# Patient Record
Sex: Female | Born: 1940 | Race: White | Hispanic: No | Marital: Married | Smoking: Current some day smoker
Health system: Southern US, Community
[De-identification: ages and names within clinical notes are randomized; demographics above are authoritative.]

## PROBLEM LIST (undated history)

## (undated) DIAGNOSIS — F419 Anxiety disorder, unspecified: Secondary | ICD-10-CM

## (undated) DIAGNOSIS — M1990 Unspecified osteoarthritis, unspecified site: Secondary | ICD-10-CM

## (undated) DIAGNOSIS — F329 Major depressive disorder, single episode, unspecified: Secondary | ICD-10-CM

## (undated) DIAGNOSIS — F32A Depression, unspecified: Secondary | ICD-10-CM

## (undated) DIAGNOSIS — R519 Headache, unspecified: Secondary | ICD-10-CM

## (undated) DIAGNOSIS — I10 Essential (primary) hypertension: Secondary | ICD-10-CM

## (undated) DIAGNOSIS — K219 Gastro-esophageal reflux disease without esophagitis: Secondary | ICD-10-CM

## (undated) DIAGNOSIS — R51 Headache: Secondary | ICD-10-CM

## (undated) HISTORY — PX: EYE SURGERY: SHX253

## (undated) HISTORY — PX: DILATION AND CURETTAGE OF UTERUS: SHX78

---

## 1998-07-28 ENCOUNTER — Other Ambulatory Visit: Admission: RE | Admit: 1998-07-28 | Discharge: 1998-07-28 | Payer: Self-pay | Admitting: Obstetrics & Gynecology

## 1999-07-29 ENCOUNTER — Other Ambulatory Visit: Admission: RE | Admit: 1999-07-29 | Discharge: 1999-07-29 | Payer: Self-pay | Admitting: Obstetrics & Gynecology

## 2000-09-07 ENCOUNTER — Other Ambulatory Visit: Admission: RE | Admit: 2000-09-07 | Discharge: 2000-09-07 | Payer: Self-pay | Admitting: Obstetrics & Gynecology

## 2003-08-18 ENCOUNTER — Other Ambulatory Visit: Admission: RE | Admit: 2003-08-18 | Discharge: 2003-08-18 | Payer: Self-pay | Admitting: Obstetrics & Gynecology

## 2004-09-30 ENCOUNTER — Other Ambulatory Visit: Admission: RE | Admit: 2004-09-30 | Discharge: 2004-09-30 | Payer: Self-pay | Admitting: Obstetrics & Gynecology

## 2005-10-31 ENCOUNTER — Other Ambulatory Visit: Admission: RE | Admit: 2005-10-31 | Discharge: 2005-10-31 | Payer: Self-pay | Admitting: Obstetrics & Gynecology

## 2012-05-03 DIAGNOSIS — Z23 Encounter for immunization: Secondary | ICD-10-CM | POA: Diagnosis not present

## 2012-05-03 DIAGNOSIS — Z79899 Other long term (current) drug therapy: Secondary | ICD-10-CM | POA: Diagnosis not present

## 2012-05-03 DIAGNOSIS — E7800 Pure hypercholesterolemia, unspecified: Secondary | ICD-10-CM | POA: Diagnosis not present

## 2012-05-03 DIAGNOSIS — I129 Hypertensive chronic kidney disease with stage 1 through stage 4 chronic kidney disease, or unspecified chronic kidney disease: Secondary | ICD-10-CM | POA: Diagnosis not present

## 2012-07-03 DIAGNOSIS — Z01419 Encounter for gynecological examination (general) (routine) without abnormal findings: Secondary | ICD-10-CM | POA: Diagnosis not present

## 2013-05-21 DIAGNOSIS — Z1231 Encounter for screening mammogram for malignant neoplasm of breast: Secondary | ICD-10-CM | POA: Diagnosis not present

## 2014-01-06 DIAGNOSIS — Z961 Presence of intraocular lens: Secondary | ICD-10-CM | POA: Diagnosis not present

## 2014-01-22 DIAGNOSIS — H26499 Other secondary cataract, unspecified eye: Secondary | ICD-10-CM | POA: Diagnosis not present

## 2014-01-29 DIAGNOSIS — H26499 Other secondary cataract, unspecified eye: Secondary | ICD-10-CM | POA: Diagnosis not present

## 2014-02-04 DIAGNOSIS — F329 Major depressive disorder, single episode, unspecified: Secondary | ICD-10-CM | POA: Diagnosis not present

## 2014-02-04 DIAGNOSIS — E7800 Pure hypercholesterolemia, unspecified: Secondary | ICD-10-CM | POA: Diagnosis not present

## 2014-02-04 DIAGNOSIS — F3289 Other specified depressive episodes: Secondary | ICD-10-CM | POA: Diagnosis not present

## 2014-02-04 DIAGNOSIS — N183 Chronic kidney disease, stage 3 unspecified: Secondary | ICD-10-CM | POA: Diagnosis not present

## 2014-02-04 DIAGNOSIS — I129 Hypertensive chronic kidney disease with stage 1 through stage 4 chronic kidney disease, or unspecified chronic kidney disease: Secondary | ICD-10-CM | POA: Diagnosis not present

## 2014-02-04 DIAGNOSIS — Z79899 Other long term (current) drug therapy: Secondary | ICD-10-CM | POA: Diagnosis not present

## 2014-08-05 DIAGNOSIS — I129 Hypertensive chronic kidney disease with stage 1 through stage 4 chronic kidney disease, or unspecified chronic kidney disease: Secondary | ICD-10-CM | POA: Diagnosis not present

## 2014-08-05 DIAGNOSIS — F3341 Major depressive disorder, recurrent, in partial remission: Secondary | ICD-10-CM | POA: Diagnosis not present

## 2014-08-05 DIAGNOSIS — N183 Chronic kidney disease, stage 3 (moderate): Secondary | ICD-10-CM | POA: Diagnosis not present

## 2015-02-16 DIAGNOSIS — H04123 Dry eye syndrome of bilateral lacrimal glands: Secondary | ICD-10-CM | POA: Diagnosis not present

## 2015-02-16 DIAGNOSIS — Z961 Presence of intraocular lens: Secondary | ICD-10-CM | POA: Diagnosis not present

## 2015-02-27 DIAGNOSIS — L218 Other seborrheic dermatitis: Secondary | ICD-10-CM | POA: Diagnosis not present

## 2015-03-13 DIAGNOSIS — F17210 Nicotine dependence, cigarettes, uncomplicated: Secondary | ICD-10-CM | POA: Diagnosis not present

## 2015-03-13 DIAGNOSIS — Z23 Encounter for immunization: Secondary | ICD-10-CM | POA: Diagnosis not present

## 2015-03-13 DIAGNOSIS — F325 Major depressive disorder, single episode, in full remission: Secondary | ICD-10-CM | POA: Diagnosis not present

## 2015-03-13 DIAGNOSIS — Z0000 Encounter for general adult medical examination without abnormal findings: Secondary | ICD-10-CM | POA: Diagnosis not present

## 2015-03-13 DIAGNOSIS — E780 Pure hypercholesterolemia: Secondary | ICD-10-CM | POA: Diagnosis not present

## 2015-03-13 DIAGNOSIS — I129 Hypertensive chronic kidney disease with stage 1 through stage 4 chronic kidney disease, or unspecified chronic kidney disease: Secondary | ICD-10-CM | POA: Diagnosis not present

## 2015-03-13 DIAGNOSIS — Z79899 Other long term (current) drug therapy: Secondary | ICD-10-CM | POA: Diagnosis not present

## 2015-03-24 DIAGNOSIS — Z1211 Encounter for screening for malignant neoplasm of colon: Secondary | ICD-10-CM | POA: Diagnosis not present

## 2015-09-14 DIAGNOSIS — Z23 Encounter for immunization: Secondary | ICD-10-CM | POA: Diagnosis not present

## 2015-09-14 DIAGNOSIS — I129 Hypertensive chronic kidney disease with stage 1 through stage 4 chronic kidney disease, or unspecified chronic kidney disease: Secondary | ICD-10-CM | POA: Diagnosis not present

## 2015-09-14 DIAGNOSIS — E7800 Pure hypercholesterolemia, unspecified: Secondary | ICD-10-CM | POA: Diagnosis not present

## 2016-01-26 DIAGNOSIS — Z961 Presence of intraocular lens: Secondary | ICD-10-CM | POA: Diagnosis not present

## 2016-01-26 DIAGNOSIS — H18412 Arcus senilis, left eye: Secondary | ICD-10-CM | POA: Diagnosis not present

## 2016-01-26 DIAGNOSIS — H18411 Arcus senilis, right eye: Secondary | ICD-10-CM | POA: Diagnosis not present

## 2016-03-14 DIAGNOSIS — E7800 Pure hypercholesterolemia, unspecified: Secondary | ICD-10-CM | POA: Diagnosis not present

## 2016-03-14 DIAGNOSIS — F17210 Nicotine dependence, cigarettes, uncomplicated: Secondary | ICD-10-CM | POA: Diagnosis not present

## 2016-03-14 DIAGNOSIS — F331 Major depressive disorder, recurrent, moderate: Secondary | ICD-10-CM | POA: Diagnosis not present

## 2016-03-14 DIAGNOSIS — Z79899 Other long term (current) drug therapy: Secondary | ICD-10-CM | POA: Diagnosis not present

## 2016-03-14 DIAGNOSIS — I129 Hypertensive chronic kidney disease with stage 1 through stage 4 chronic kidney disease, or unspecified chronic kidney disease: Secondary | ICD-10-CM | POA: Diagnosis not present

## 2016-03-14 DIAGNOSIS — Z0000 Encounter for general adult medical examination without abnormal findings: Secondary | ICD-10-CM | POA: Diagnosis not present

## 2016-03-17 ENCOUNTER — Other Ambulatory Visit: Payer: Self-pay | Admitting: Geriatric Medicine

## 2016-03-17 DIAGNOSIS — R17 Unspecified jaundice: Secondary | ICD-10-CM

## 2016-03-18 ENCOUNTER — Ambulatory Visit
Admission: RE | Admit: 2016-03-18 | Discharge: 2016-03-18 | Disposition: A | Discharge status: Home / Self Care | Payer: BLUE CROSS/BLUE SHIELD | Source: Ambulatory Visit | Attending: Geriatric Medicine | Admitting: Geriatric Medicine

## 2016-03-18 DIAGNOSIS — R17 Unspecified jaundice: Secondary | ICD-10-CM | POA: Diagnosis not present

## 2016-03-22 ENCOUNTER — Encounter (HOSPITAL_COMMUNITY): Payer: Self-pay | Admitting: *Deleted

## 2016-03-22 ENCOUNTER — Other Ambulatory Visit: Payer: Self-pay | Admitting: Gastroenterology

## 2016-03-22 DIAGNOSIS — R740 Nonspecific elevation of levels of transaminase and lactic acid dehydrogenase [LDH]: Secondary | ICD-10-CM | POA: Diagnosis not present

## 2016-03-22 DIAGNOSIS — R932 Abnormal findings on diagnostic imaging of liver and biliary tract: Secondary | ICD-10-CM | POA: Diagnosis not present

## 2016-03-23 ENCOUNTER — Ambulatory Visit (HOSPITAL_COMMUNITY): Payer: Medicare Other | Admitting: Anesthesiology

## 2016-03-23 ENCOUNTER — Ambulatory Visit (HOSPITAL_COMMUNITY)
Admission: RE | Admit: 2016-03-23 | Discharge: 2016-03-23 | Disposition: A | Discharge status: Home / Self Care | Payer: Medicare Other | Source: Ambulatory Visit | Attending: Gastroenterology | Admitting: Gastroenterology

## 2016-03-23 ENCOUNTER — Ambulatory Visit (HOSPITAL_COMMUNITY): Payer: Medicare Other

## 2016-03-23 ENCOUNTER — Encounter (HOSPITAL_COMMUNITY)
Admission: RE | Disposition: A | Discharge status: Home / Self Care | Payer: Self-pay | Source: Ambulatory Visit | Attending: Gastroenterology

## 2016-03-23 ENCOUNTER — Encounter (HOSPITAL_COMMUNITY): Payer: Self-pay

## 2016-03-23 DIAGNOSIS — M1990 Unspecified osteoarthritis, unspecified site: Secondary | ICD-10-CM | POA: Diagnosis not present

## 2016-03-23 DIAGNOSIS — E7800 Pure hypercholesterolemia, unspecified: Secondary | ICD-10-CM | POA: Diagnosis not present

## 2016-03-23 DIAGNOSIS — Z7982 Long term (current) use of aspirin: Secondary | ICD-10-CM | POA: Diagnosis not present

## 2016-03-23 DIAGNOSIS — K8020 Calculus of gallbladder without cholecystitis without obstruction: Secondary | ICD-10-CM | POA: Diagnosis not present

## 2016-03-23 DIAGNOSIS — R945 Abnormal results of liver function studies: Secondary | ICD-10-CM

## 2016-03-23 DIAGNOSIS — K8050 Calculus of bile duct without cholangitis or cholecystitis without obstruction: Secondary | ICD-10-CM | POA: Diagnosis not present

## 2016-03-23 DIAGNOSIS — Z79899 Other long term (current) drug therapy: Secondary | ICD-10-CM | POA: Insufficient documentation

## 2016-03-23 DIAGNOSIS — I10 Essential (primary) hypertension: Secondary | ICD-10-CM | POA: Insufficient documentation

## 2016-03-23 DIAGNOSIS — F17210 Nicotine dependence, cigarettes, uncomplicated: Secondary | ICD-10-CM | POA: Diagnosis not present

## 2016-03-23 DIAGNOSIS — R7989 Other specified abnormal findings of blood chemistry: Secondary | ICD-10-CM

## 2016-03-23 DIAGNOSIS — R740 Nonspecific elevation of levels of transaminase and lactic acid dehydrogenase [LDH]: Secondary | ICD-10-CM | POA: Diagnosis present

## 2016-03-23 HISTORY — DX: Anxiety disorder, unspecified: F41.9

## 2016-03-23 HISTORY — DX: Unspecified osteoarthritis, unspecified site: M19.90

## 2016-03-23 HISTORY — DX: Major depressive disorder, single episode, unspecified: F32.9

## 2016-03-23 HISTORY — DX: Headache: R51

## 2016-03-23 HISTORY — DX: Essential (primary) hypertension: I10

## 2016-03-23 HISTORY — DX: Headache, unspecified: R51.9

## 2016-03-23 HISTORY — PX: ERCP: SHX5425

## 2016-03-23 HISTORY — DX: Gastro-esophageal reflux disease without esophagitis: K21.9

## 2016-03-23 HISTORY — DX: Depression, unspecified: F32.A

## 2016-03-23 SURGERY — ERCP, WITH INTERVENTION IF INDICATED
Anesthesia: General

## 2016-03-23 MED ORDER — DEXTROSE 5 % IV SOLN
2.0000 g | INTRAVENOUS | Status: AC
  Administered 2016-03-23: 2 g via INTRAVENOUS
  Filled 2016-03-23: qty 2

## 2016-03-23 MED ORDER — LIDOCAINE HCL (PF) 2 % IJ SOLN
INTRAMUSCULAR | Status: DC | PRN
  Administered 2016-03-23: 30 mg via INTRADERMAL

## 2016-03-23 MED ORDER — PROPOFOL 10 MG/ML IV BOLUS
INTRAVENOUS | Status: AC
  Filled 2016-03-23: qty 20

## 2016-03-23 MED ORDER — SUCCINYLCHOLINE CHLORIDE 20 MG/ML IJ SOLN
INTRAMUSCULAR | Status: DC | PRN
  Administered 2016-03-23: 80 mg via INTRAVENOUS

## 2016-03-23 MED ORDER — FENTANYL CITRATE (PF) 100 MCG/2ML IJ SOLN
INTRAMUSCULAR | Status: AC
  Filled 2016-03-23: qty 2

## 2016-03-23 MED ORDER — PHENYLEPHRINE 40 MCG/ML (10ML) SYRINGE FOR IV PUSH (FOR BLOOD PRESSURE SUPPORT)
PREFILLED_SYRINGE | INTRAVENOUS | Status: DC | PRN
  Administered 2016-03-23 (×2): 80 ug via INTRAVENOUS

## 2016-03-23 MED ORDER — LACTATED RINGERS IV SOLN
INTRAVENOUS | Status: DC
  Administered 2016-03-23: 1000 mL via INTRAVENOUS

## 2016-03-23 MED ORDER — FENTANYL CITRATE (PF) 100 MCG/2ML IJ SOLN: 25.0000 ug | INTRAMUSCULAR | Status: DC | PRN

## 2016-03-23 MED ORDER — GLYCOPYRROLATE 0.2 MG/ML IJ SOLN
INTRAMUSCULAR | Status: AC
  Filled 2016-03-23: qty 1

## 2016-03-23 MED ORDER — LACTATED RINGERS IV SOLN: INTRAVENOUS | Status: DC

## 2016-03-23 MED ORDER — ONDANSETRON HCL 4 MG/2ML IJ SOLN
INTRAMUSCULAR | Status: AC
  Filled 2016-03-23: qty 2

## 2016-03-23 MED ORDER — GLYCOPYRROLATE 0.2 MG/ML IJ SOLN
INTRAMUSCULAR | Status: DC | PRN
  Administered 2016-03-23: 0.2 mg via INTRAVENOUS

## 2016-03-23 MED ORDER — ONDANSETRON HCL 4 MG/2ML IJ SOLN
INTRAMUSCULAR | Status: DC | PRN
  Administered 2016-03-23: 4 mg via INTRAVENOUS

## 2016-03-23 MED ORDER — SODIUM CHLORIDE 0.9 % IV SOLN: INTRAVENOUS | Status: DC

## 2016-03-23 MED ORDER — PROPOFOL 10 MG/ML IV BOLUS
INTRAVENOUS | Status: DC | PRN
  Administered 2016-03-23: 130 mg via INTRAVENOUS

## 2016-03-23 MED ORDER — IOHEXOL 350 MG/ML SOLN
INTRAVENOUS | Status: DC | PRN
  Administered 2016-03-23: 80 mL

## 2016-03-23 MED ORDER — DEXAMETHASONE SODIUM PHOSPHATE 10 MG/ML IJ SOLN
INTRAMUSCULAR | Status: DC | PRN
  Administered 2016-03-23: 10 mg via INTRAVENOUS

## 2016-03-23 MED ORDER — FENTANYL CITRATE (PF) 100 MCG/2ML IJ SOLN
INTRAMUSCULAR | Status: DC | PRN
  Administered 2016-03-23 (×2): 50 ug via INTRAVENOUS

## 2016-03-23 MED ORDER — DEXAMETHASONE SODIUM PHOSPHATE 10 MG/ML IJ SOLN
INTRAMUSCULAR | Status: AC
  Filled 2016-03-23: qty 1

## 2016-03-23 MED ORDER — PHENYLEPHRINE 40 MCG/ML (10ML) SYRINGE FOR IV PUSH (FOR BLOOD PRESSURE SUPPORT)
PREFILLED_SYRINGE | INTRAVENOUS | Status: AC
  Filled 2016-03-23: qty 10

## 2016-03-23 NOTE — Transfer of Care (Signed)
Immediate Anesthesia Transfer of Care Note  Patient: Cheryl Leblanc  Procedure(s) Performed: Procedure(s): ENDOSCOPIC RETROGRADE CHOLANGIOPANCREATOGRAPHY (ERCP) (N/A)  Patient Location: PACU  Anesthesia Type:General  Level of Consciousness:  sedated, patient cooperative and responds to stimulation  Airway & Oxygen Therapy:Patient Spontanous Breathing and Patient connected to face mask oxgen  Post-op Assessment:  Report given to PACU RN and Post -op Vital signs reviewed and stable  Post vital signs:  Reviewed and stable  Last Vitals:  Filed Vitals:   03/23/16 1133  BP: 131/76  Pulse: 60  Temp: 36.8 C  Resp: 13    Complications: No apparent anesthesia complications

## 2016-03-23 NOTE — Op Note (Signed)
[facility] Patient Name: Cheryl Leblanc Procedure Date: 03/23/2016 MRN: [id number] Attending MD: Vida Rigger , MD Date of Birth: 1941-03-30 CSN: [id number] Age: 75 Admit Type: Outpatient Procedure:                ERCP Indications:              Biliary dilation on Ultrasound questionable stone                            versus tumor and abnormal liver tests Providers:                Vida Rigger, MD, Anthony Sar, RN, Oletha Blend,                            Technician, Early Osmond, CRNA Referring MD:              Medicines:                General Anesthesia Complications:            No immediate complications. Estimated Blood Loss:     Estimated blood loss: none. Procedure:                Pre-Anesthesia Assessment:                           - Prior to the procedure, a History and Physical                            was performed, and patient medications and                            allergies were reviewed. The patient's tolerance of                            previous anesthesia was also reviewed. The risks                            and benefits of the procedure and the sedation                            options and risks were discussed with the patient.                            All questions were answered, and informed consent                            was obtained. Prior Anticoagulants: The patient has                            taken no previous anticoagulant or antiplatelet                            agents. ASA Grade Assessment: II - A patient with  mild systemic disease. After reviewing the risks                            and benefits, the patient was deemed in                            satisfactory condition to undergo the procedure.                           After obtaining informed consent, the scope was                            passed under direct vision. Throughout the                            procedure, the  patient's blood pressure, pulse, and                            oxygen saturations were monitored continuously. The                            [id number] ([id number] scope was introduced through                            the mouth, and used to inject contrast into and                            used to locate the major papilla. The ERCP was                            somewhat difficult due to Large stone. Successful                            completion of the procedure was aided by performing                            the maneuvers documented (below) in this report.                            The patient tolerated the procedure well. Scope In: Scope Out: Findings:      bulbous ampullaThe major papilla was bulging. initial cannulation and       injection revealed a dilated bile duct with multiple large stones and we       proceeded with Biliary sphincterotomy was made with a Hydratome       sphincterotome using ERBE electrocauteryin the customary fashion until       he had adequatebiliary drainage and could get the fully bowed       sphincterotome easily in and out of the duct and there was no pancreatic       duct injection or wire advancement throughout the procedure. There was       no post-sphincterotomy bleeding. Choledocholithiasis was found in a       dilated duct. Lithotripsy with a basket-type device was successful using  the 3 cm basket x4. The biliary tree was swept with an 15- 18 mm       adjustableballoon starting at the bifurcation. Multiple stones were       removed. initially we had to lower the balloon to 15 mm to remove       through the patent sphincterotomy site and there were still residual       stones which required returning to mechanical lithotripsy as above 3       more times we then swept the duct with the larger balloon and the       adjustable 12-15 mm that injected below balloon and although there was       some distal duct narrowing We did not see a  significant strictures and       after multiple balloon pull-through' no stones remained. the balloons       passed much more readily through the patent sphincterotomy and no       residual stones were found. Impression:               - The major papilla appeared to be bulging. no PD                            injection or wire advancement throughout the                            procedure                           - Choledocholithiasis was found. Complete removal                            was accomplished by biliary sphincterotomy and                            balloon extraction and mechanical lithotripsy as                            above.                           - A biliary sphincterotomy was performed.                           - Lithotripsy was successful.                           - The biliary tree was swept and nothing was found. Moderate Sedation:      N/A- Per Anesthesia Care Recommendation:           - Avoid aspirin and nonsteroidal anti-inflammatory                            medicines for 5 days.                           - Continue present medications.                           -  Clear liquid diet for 6 hours.                           - Return to GI clinic in 2 weeks to recheck                            symptoms and repeat liver tests.                           - Telephone GI clinic if symptomatic PRN. Procedure Code(s):        --- Professional ---                           [phone], Esophagogastroduodenoscopy, flexible,                            transoral; diagnostic, including collection of                            specimen(s) by brushing or washing, when performed                            (separate procedure) Diagnosis Code(s):        --- Professional ---                           K83.9, Disease of biliary tract, unspecified                           K80.50, Calculus of bile duct without cholangitis                            or cholecystitis without  obstruction                           K83.8, Other specified diseases of biliary tract CPT copyright 2016 American Medical Association. All rights reserved. The codes documented in this report are preliminary and upon coder review may  be revised to meet current compliance requirements. Vida Rigger, MD 03/23/2016 2:14:10 PM This report has been signed electronically. Number of Addenda: 0

## 2016-03-23 NOTE — Discharge Instructions (Addendum)
Call if question or problem or if symptoms increase or if signs of GI bleeding otherwise call my nurse Britta MccreedyBarbara and make follow-up for 2 weeks to repeat check symptoms and liver tests and begin with clear liquid diet until 6:30 PM and if okay may have soft solids and then slowly advance as toleratedEndoscopic Retrograde Cholangiopancreatography (ERCP), Care After Refer to this sheet in the next few weeks. These instructions provide you with information on caring for yourself after your procedure. Your health care provider may also give you more specific instructions. Your treatment has been planned according to current medical practices, but problems sometimes occur. Call your health care provider if you have any problems or questions after your procedure.  WHAT TO EXPECT AFTER THE PROCEDURE  After your procedure, it is typical to feel:   Soreness in your throat.   Sick to your stomach (nauseous).   Bloated.  Dizzy.   Fatigued. HOME CARE INSTRUCTIONS  Have a friend or family member stay with you for the first 24 hours after your procedure.  Start taking your usual medicines and eating normally as soon as you feel well enough to do so or as directed by your health care provider. SEEK MEDICAL CARE IF:  You have abdominal pain.   You develop signs of infection, such as:   Chills.   Feeling unwell.  SEEK IMMEDIATE MEDICAL CARE IF:  You have difficulty swallowing.  You have worsening throat, chest, or abdominal pain.  You vomit.  You have bloody or very black stools.  You have a fever.   This information is not intended to replace advice given to you by your health care provider. Make sure you discuss any questions you have with your health care provider.   Document Released: 07/31/2013 Document Reviewed: 07/31/2013 Elsevier Interactive Patient Education [employer]2016 Elsevier Inc.

## 2016-03-23 NOTE — Anesthesia Procedure Notes (Signed)
Procedure Name: Intubation Date/Time: 03/23/2016 12:41 PM Performed by: Early OsmondEARGLE, Doron Shake E Pre-anesthesia Checklist: Patient identified, Emergency Drugs available, Suction available and Patient being monitored Patient Re-evaluated:Patient Re-evaluated prior to inductionOxygen Delivery Method: Circle system utilized Preoxygenation: Pre-oxygenation with 100% oxygen Intubation Type: IV induction Ventilation: Mask ventilation without difficulty Laryngoscope Size: Miller and 2 Grade View: Grade II Tube type: Oral Tube size: 7.0 mm Number of attempts: 1 Airway Equipment and Method: Stylet and Bite block Placement Confirmation: ETT inserted through vocal cords under direct vision,  positive ETCO2 and breath sounds checked- equal and bilateral Secured at: 22 cm Tube secured with: Tape Dental Injury: Teeth and Oropharynx as per pre-operative assessment

## 2016-03-23 NOTE — Anesthesia Postprocedure Evaluation (Signed)
Anesthesia Post Note  Patient: Cheryl NianMargaret Leblanc  Procedure(s) Performed: Procedure(s) (LRB): ENDOSCOPIC RETROGRADE CHOLANGIOPANCREATOGRAPHY (ERCP) (N/A)  Patient location during evaluation: PACU Anesthesia Type: General Level of consciousness: awake and alert Pain management: pain level controlled Vital Signs Assessment: post-procedure vital signs reviewed and stable Respiratory status: spontaneous breathing, nonlabored ventilation, respiratory function stable and patient connected to nasal cannula oxygen Cardiovascular status: blood pressure returned to baseline and stable Postop Assessment: no signs of nausea or vomiting Anesthetic complications: no    Last Vitals:  Filed Vitals:   03/23/16 1430 03/23/16 1440  BP: 124/77 105/71  Pulse: 80 80  Temp:    Resp: 15 13    Last Pain: There were no vitals filed for this visit.               Ihor Meinzer L

## 2016-03-23 NOTE — Anesthesia Preprocedure Evaluation (Addendum)
Anesthesia Evaluation  Patient identified by MRN, date of birth, ID band Patient awake    Reviewed: Allergy & Precautions, H&P , NPO status , Patient's Chart, lab work & pertinent test results  Airway Mallampati: II  TM Distance: >3 FB Neck ROM: full    Dental no notable dental hx. (+) Dental Advisory Given, Teeth Intact   Pulmonary Current Smoker,    Pulmonary exam normal breath sounds clear to auscultation       Cardiovascular Exercise Tolerance: Good hypertension, Normal cardiovascular exam Rhythm:regular Rate:Normal     Neuro/Psych  Headaches, Anxiety Depression negative neurological ROS  negative psych ROS   GI/Hepatic negative GI ROS, Neg liver ROS,   Endo/Other  negative endocrine ROS  Renal/GU negative Renal ROS  negative genitourinary   Musculoskeletal   Abdominal   Peds  Hematology negative hematology ROS (+)   Anesthesia Other Findings   Reproductive/Obstetrics negative OB ROS                            Anesthesia Physical Anesthesia Plan  ASA: III  Anesthesia Plan: General   Post-op Pain Management:    Induction: Intravenous  Airway Management Planned: Oral ETT  Additional Equipment:   Intra-op Plan:   Post-operative Plan: Extubation in OR  Informed Consent: I have reviewed the patients History and Physical, chart, labs and discussed the procedure including the risks, benefits and alternatives for the proposed anesthesia with the patient or authorized representative who has indicated his/her understanding and acceptance.   Dental Advisory Given  Plan Discussed with: CRNA and Surgeon  Anesthesia Plan Comments:        Anesthesia Quick Evaluation

## 2016-03-23 NOTE — Progress Notes (Signed)
Cheryl Leblanc 12:32 PM  Subjective: Patient without any new complaints since I saw her yesterday in the office and her case was discussed with her husband and we answered all of their questions  Objective: Vital signs stable afebrile no acute distress exam please see preassessment evaluation labs and ultrasound reviewed yesterday  Assessment: CBD obstruction questionable tumor versus stones and sludge  Plan: We discussed the procedure with her and her husband again including success rate and will proceed this afternoon with anesthesia assistance  [facility] PartnershipMAGOD,Jamarri Vuncannon E  Pager [phone] After 5PM or if no answer call [phone]

## 2016-03-29 ENCOUNTER — Encounter (HOSPITAL_COMMUNITY): Payer: Self-pay | Admitting: Gastroenterology

## 2016-04-13 DIAGNOSIS — R740 Nonspecific elevation of levels of transaminase and lactic acid dehydrogenase [LDH]: Secondary | ICD-10-CM | POA: Diagnosis not present

## 2016-04-13 DIAGNOSIS — R932 Abnormal findings on diagnostic imaging of liver and biliary tract: Secondary | ICD-10-CM | POA: Diagnosis not present

## 2016-07-15 DIAGNOSIS — R740 Nonspecific elevation of levels of transaminase and lactic acid dehydrogenase [LDH]: Secondary | ICD-10-CM | POA: Diagnosis not present

## 2016-10-11 DIAGNOSIS — Z23 Encounter for immunization: Secondary | ICD-10-CM | POA: Diagnosis not present

## 2016-10-11 DIAGNOSIS — I10 Essential (primary) hypertension: Secondary | ICD-10-CM | POA: Diagnosis not present

## 2016-10-11 DIAGNOSIS — Z1389 Encounter for screening for other disorder: Secondary | ICD-10-CM | POA: Diagnosis not present

## 2017-03-14 DIAGNOSIS — Z961 Presence of intraocular lens: Secondary | ICD-10-CM | POA: Diagnosis not present

## 2017-03-14 DIAGNOSIS — H16223 Keratoconjunctivitis sicca, not specified as Sjogren's, bilateral: Secondary | ICD-10-CM | POA: Diagnosis not present

## 2017-03-14 DIAGNOSIS — H18413 Arcus senilis, bilateral: Secondary | ICD-10-CM | POA: Diagnosis not present

## 2017-03-14 DIAGNOSIS — H02839 Dermatochalasis of unspecified eye, unspecified eyelid: Secondary | ICD-10-CM | POA: Diagnosis not present

## 2017-03-17 DIAGNOSIS — N183 Chronic kidney disease, stage 3 (moderate): Secondary | ICD-10-CM | POA: Diagnosis not present

## 2017-03-17 DIAGNOSIS — Z79899 Other long term (current) drug therapy: Secondary | ICD-10-CM | POA: Diagnosis not present

## 2017-03-17 DIAGNOSIS — D696 Thrombocytopenia, unspecified: Secondary | ICD-10-CM | POA: Diagnosis not present

## 2017-03-17 DIAGNOSIS — E7800 Pure hypercholesterolemia, unspecified: Secondary | ICD-10-CM | POA: Diagnosis not present

## 2017-03-17 DIAGNOSIS — I129 Hypertensive chronic kidney disease with stage 1 through stage 4 chronic kidney disease, or unspecified chronic kidney disease: Secondary | ICD-10-CM | POA: Diagnosis not present

## 2017-03-17 DIAGNOSIS — Z0000 Encounter for general adult medical examination without abnormal findings: Secondary | ICD-10-CM | POA: Diagnosis not present

## 2017-03-17 DIAGNOSIS — F325 Major depressive disorder, single episode, in full remission: Secondary | ICD-10-CM | POA: Diagnosis not present

## 2017-05-17 DIAGNOSIS — D696 Thrombocytopenia, unspecified: Secondary | ICD-10-CM | POA: Diagnosis not present

## 2017-09-19 DIAGNOSIS — Z23 Encounter for immunization: Secondary | ICD-10-CM | POA: Diagnosis not present

## 2017-09-19 DIAGNOSIS — I129 Hypertensive chronic kidney disease with stage 1 through stage 4 chronic kidney disease, or unspecified chronic kidney disease: Secondary | ICD-10-CM | POA: Diagnosis not present

## 2018-03-13 DIAGNOSIS — H18413 Arcus senilis, bilateral: Secondary | ICD-10-CM | POA: Diagnosis not present

## 2018-03-13 DIAGNOSIS — H16223 Keratoconjunctivitis sicca, not specified as Sjogren's, bilateral: Secondary | ICD-10-CM | POA: Diagnosis not present

## 2018-03-13 DIAGNOSIS — Z961 Presence of intraocular lens: Secondary | ICD-10-CM | POA: Diagnosis not present

## 2018-03-13 DIAGNOSIS — H02839 Dermatochalasis of unspecified eye, unspecified eyelid: Secondary | ICD-10-CM | POA: Diagnosis not present

## 2018-03-23 DIAGNOSIS — E7800 Pure hypercholesterolemia, unspecified: Secondary | ICD-10-CM | POA: Diagnosis not present

## 2018-03-23 DIAGNOSIS — Z0000 Encounter for general adult medical examination without abnormal findings: Secondary | ICD-10-CM | POA: Diagnosis not present

## 2018-03-23 DIAGNOSIS — D696 Thrombocytopenia, unspecified: Secondary | ICD-10-CM | POA: Diagnosis not present

## 2018-03-23 DIAGNOSIS — N183 Chronic kidney disease, stage 3 (moderate): Secondary | ICD-10-CM | POA: Diagnosis not present

## 2018-03-23 DIAGNOSIS — D649 Anemia, unspecified: Secondary | ICD-10-CM | POA: Diagnosis not present

## 2018-03-23 DIAGNOSIS — F5101 Primary insomnia: Secondary | ICD-10-CM | POA: Diagnosis not present

## 2018-03-23 DIAGNOSIS — F325 Major depressive disorder, single episode, in full remission: Secondary | ICD-10-CM | POA: Diagnosis not present

## 2018-03-23 DIAGNOSIS — Z79899 Other long term (current) drug therapy: Secondary | ICD-10-CM | POA: Diagnosis not present

## 2018-03-23 DIAGNOSIS — F17210 Nicotine dependence, cigarettes, uncomplicated: Secondary | ICD-10-CM | POA: Diagnosis not present

## 2018-03-23 DIAGNOSIS — Z1389 Encounter for screening for other disorder: Secondary | ICD-10-CM | POA: Diagnosis not present

## 2018-03-23 DIAGNOSIS — I129 Hypertensive chronic kidney disease with stage 1 through stage 4 chronic kidney disease, or unspecified chronic kidney disease: Secondary | ICD-10-CM | POA: Diagnosis not present

## 2018-03-30 IMAGING — US US ABDOMEN LIMITED
1 series · 14 of 25 positions shown · non-contrast
Comparison: None.

CLINICAL DATA: Jaundice for few days. History of nausea and
vomiting 2 months.

EXAM:
US ABDOMEN LIMITED - RIGHT UPPER QUADRANT

[Series 1: us abdomen limited · 0.21mm/px · 14 of 73 slices shown]
[im 1/73]
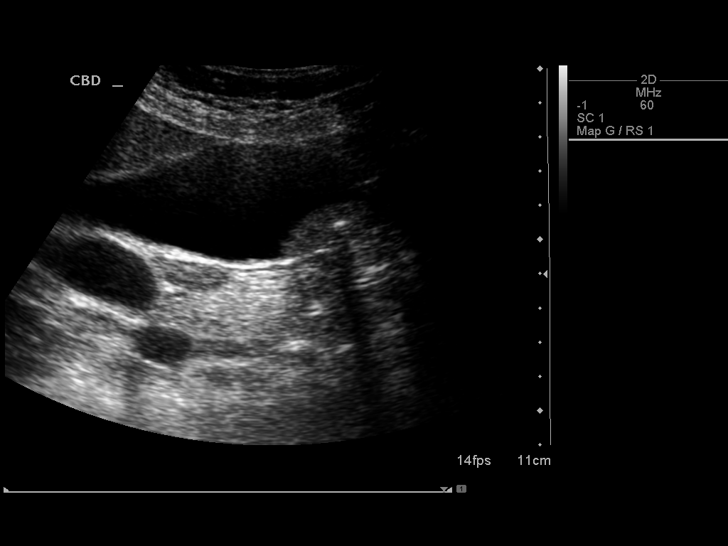
[im 7/73]
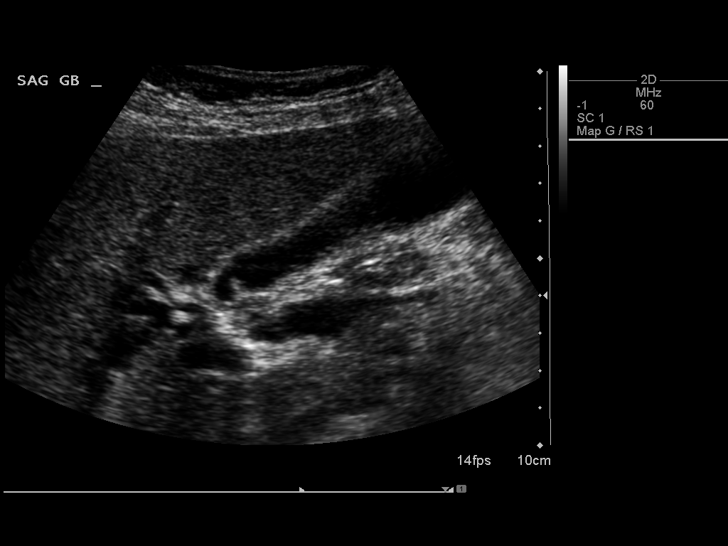
[im 13/73]
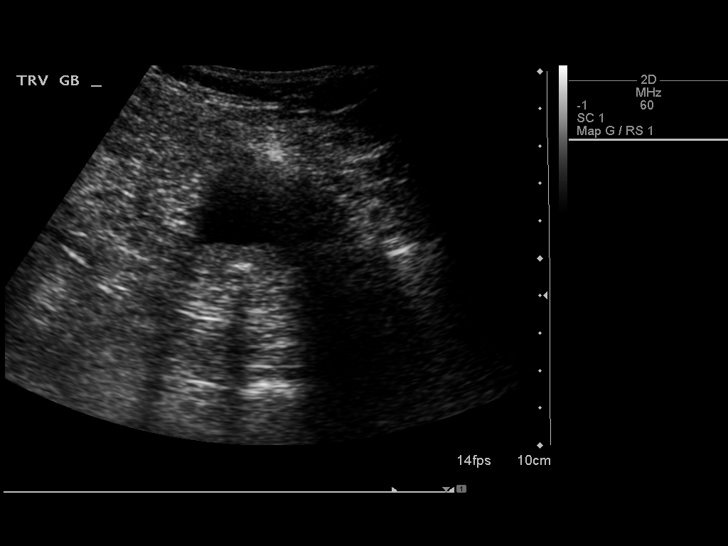
[im 19/73]
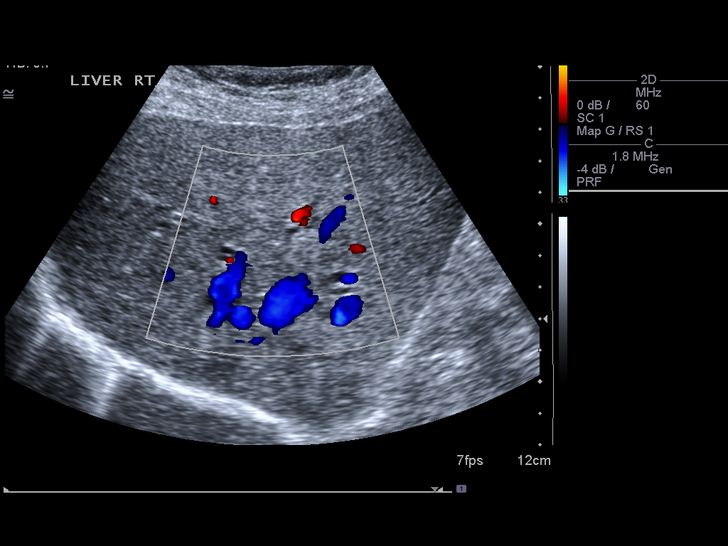
[im 25/73]
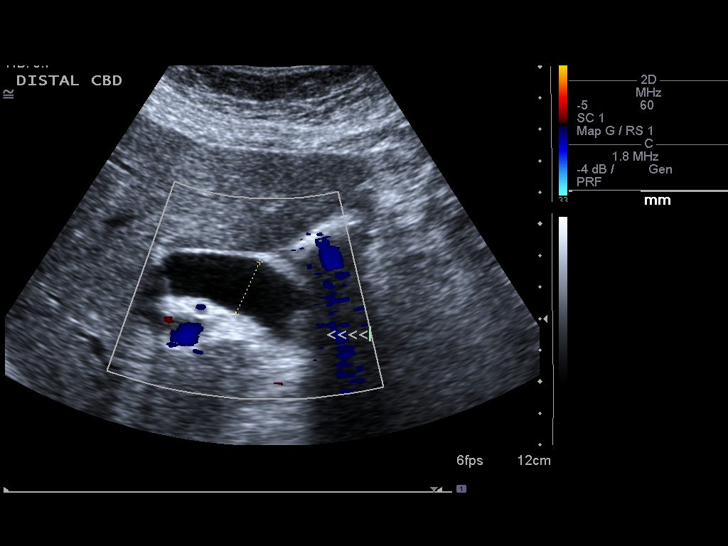
[im 28/73]
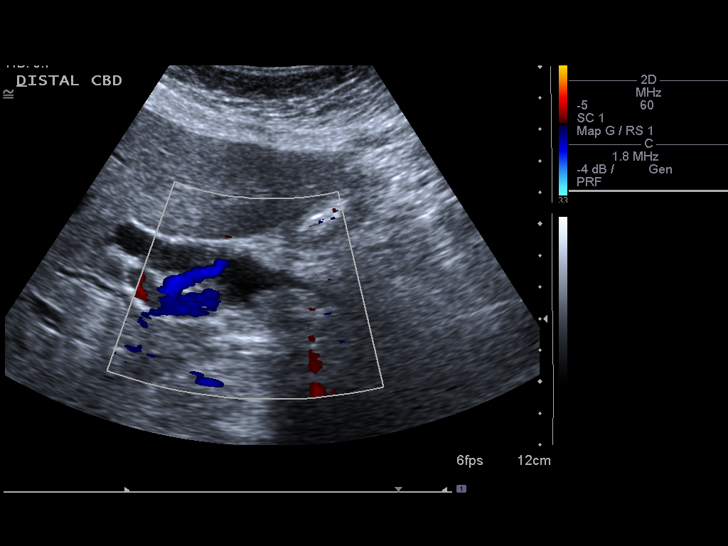
[im 34/73]
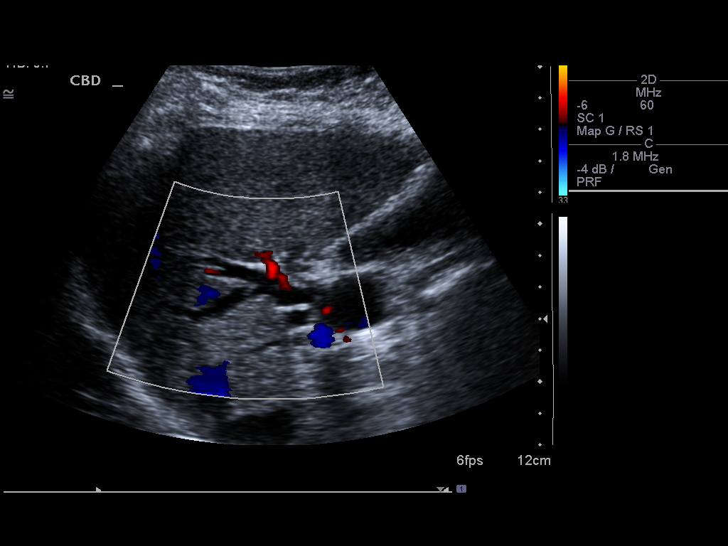
[im 40/73]
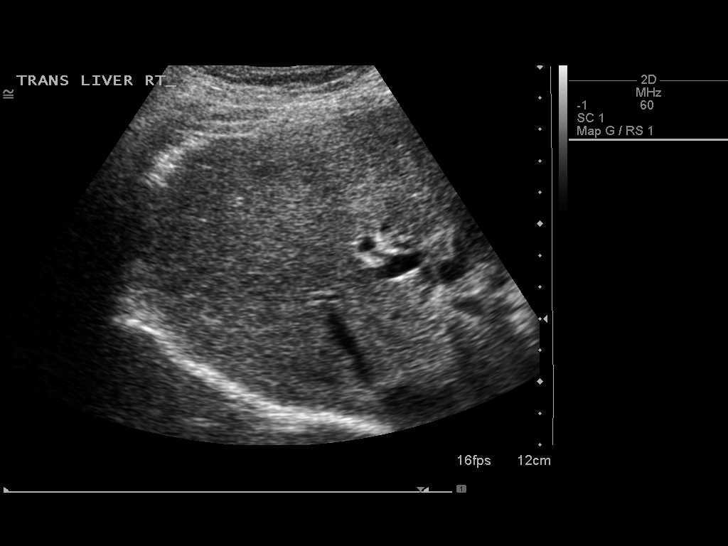
[im 46/73]
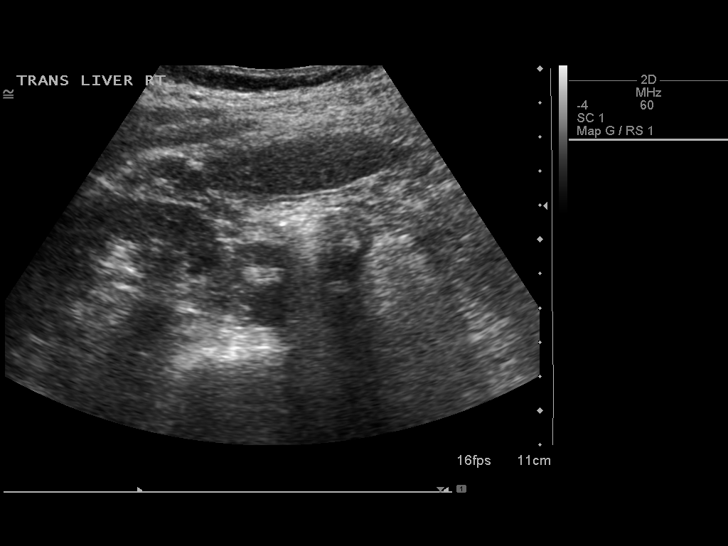
[im 49/73]
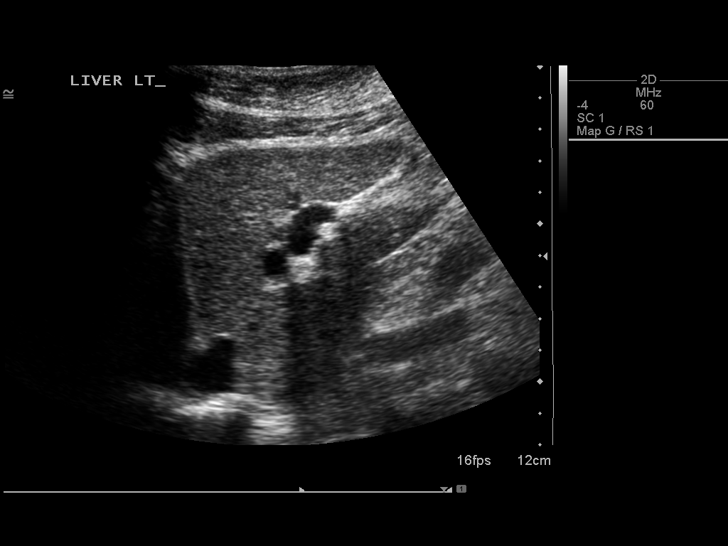
[im 55/73]
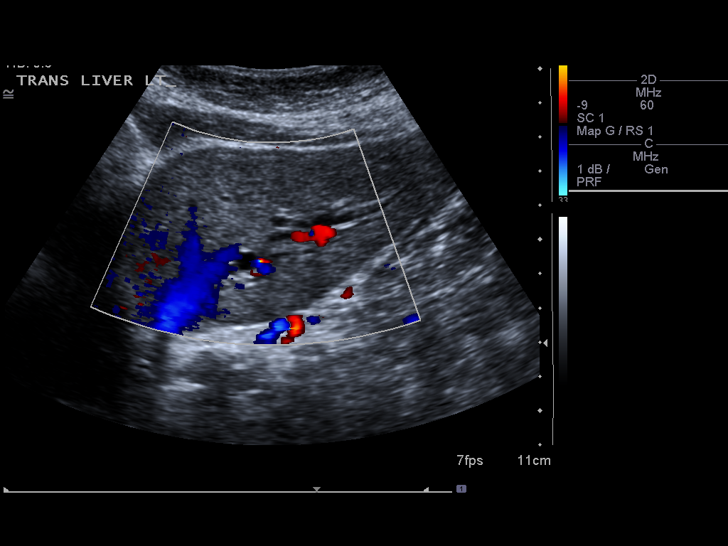
[im 61/73]
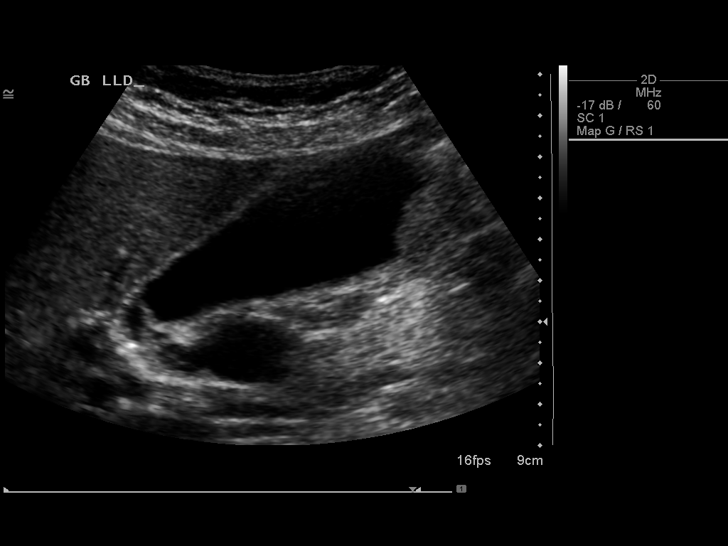
[im 67/73]
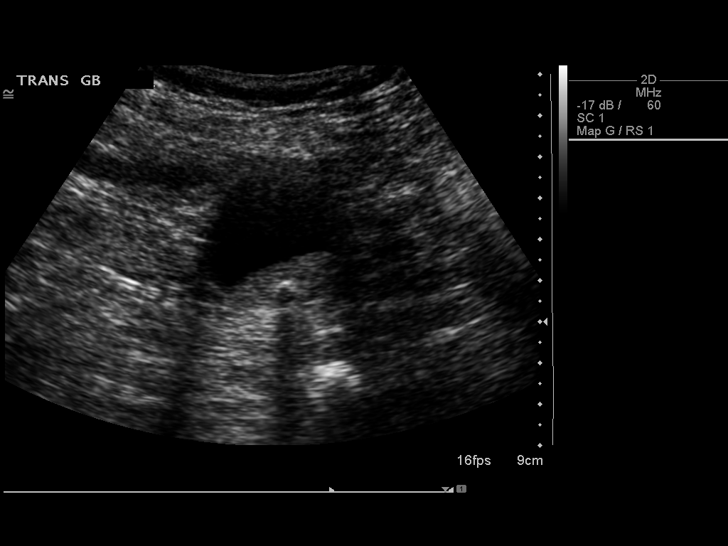
[im 73/73]
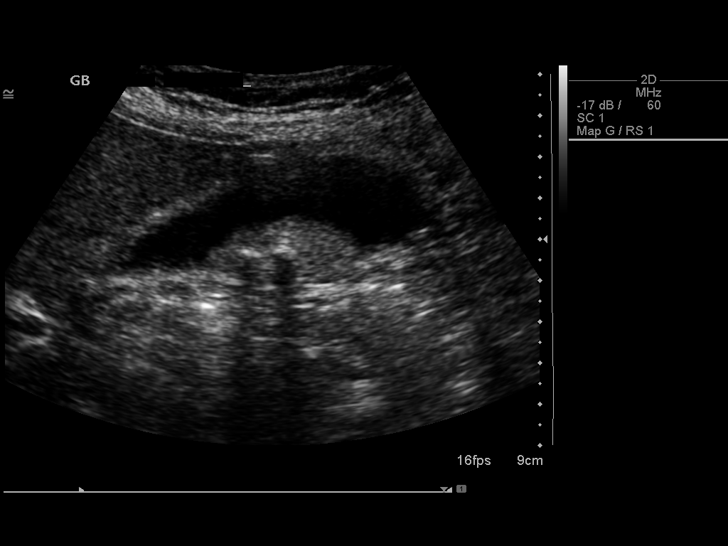

[14 of 25 positions shown; findings below may reference images not displayed]

FINDINGS: Gallbladder:

In the gallbladder, mobile sludge and at least 2 stones are
identified. Stones measure on the order of 4-5 mm in diameter.
Gallbladder wall thickness is 2.1 mm. No sonographic Murphy's sign.

Common bile duct:

Diameter: 15.1 mm solid appearing avascular mass or sludge
identified within the distal common bile duct measuring 2.1 x 1.5 x
1.8 cm.

Liver:

Heterogeneous.  No focal liver mass identified.
IMPRESSION: 1. Dilated common bile duct containing mass or sludge with stones.
Consider further evaluation with MRCP or ERCP.
2. Sludge and stones within the gallbladder.
3. No sonographic Murphy's sign.
These results will be called to the ordering clinician or
representative by the Radiologist Assistant, and communication
documented in the PACS or zVision Dashboard.

## 2019-04-30 DIAGNOSIS — H02839 Dermatochalasis of unspecified eye, unspecified eyelid: Secondary | ICD-10-CM | POA: Diagnosis not present

## 2019-04-30 DIAGNOSIS — H16223 Keratoconjunctivitis sicca, not specified as Sjogren's, bilateral: Secondary | ICD-10-CM | POA: Diagnosis not present

## 2019-04-30 DIAGNOSIS — H18413 Arcus senilis, bilateral: Secondary | ICD-10-CM | POA: Diagnosis not present

## 2019-04-30 DIAGNOSIS — Z961 Presence of intraocular lens: Secondary | ICD-10-CM | POA: Diagnosis not present

## 2019-08-05 DIAGNOSIS — D696 Thrombocytopenia, unspecified: Secondary | ICD-10-CM | POA: Diagnosis not present

## 2019-08-05 DIAGNOSIS — D500 Iron deficiency anemia secondary to blood loss (chronic): Secondary | ICD-10-CM | POA: Diagnosis not present

## 2019-08-05 DIAGNOSIS — N1831 Chronic kidney disease, stage 3a: Secondary | ICD-10-CM | POA: Diagnosis not present

## 2019-08-05 DIAGNOSIS — Z0000 Encounter for general adult medical examination without abnormal findings: Secondary | ICD-10-CM | POA: Diagnosis not present

## 2019-08-05 DIAGNOSIS — Z1389 Encounter for screening for other disorder: Secondary | ICD-10-CM | POA: Diagnosis not present

## 2019-08-05 DIAGNOSIS — E7800 Pure hypercholesterolemia, unspecified: Secondary | ICD-10-CM | POA: Diagnosis not present

## 2019-08-05 DIAGNOSIS — Z23 Encounter for immunization: Secondary | ICD-10-CM | POA: Diagnosis not present

## 2019-08-05 DIAGNOSIS — I129 Hypertensive chronic kidney disease with stage 1 through stage 4 chronic kidney disease, or unspecified chronic kidney disease: Secondary | ICD-10-CM | POA: Diagnosis not present

## 2019-08-05 DIAGNOSIS — F325 Major depressive disorder, single episode, in full remission: Secondary | ICD-10-CM | POA: Diagnosis not present

## 2019-09-04 DIAGNOSIS — I10 Essential (primary) hypertension: Secondary | ICD-10-CM | POA: Diagnosis not present

## 2020-01-20 DIAGNOSIS — N1831 Chronic kidney disease, stage 3a: Secondary | ICD-10-CM | POA: Diagnosis not present

## 2020-01-20 DIAGNOSIS — I129 Hypertensive chronic kidney disease with stage 1 through stage 4 chronic kidney disease, or unspecified chronic kidney disease: Secondary | ICD-10-CM | POA: Diagnosis not present

## 2020-05-01 ENCOUNTER — Ambulatory Visit (HOSPITAL_COMMUNITY)
Admission: EM | Admit: 2020-05-01 | Discharge: 2020-05-01 | Disposition: A | Discharge status: Home / Self Care | Payer: Medicare Other | Attending: Family Medicine | Admitting: Family Medicine

## 2020-05-01 ENCOUNTER — Encounter (HOSPITAL_COMMUNITY): Payer: Self-pay

## 2020-05-01 ENCOUNTER — Ambulatory Visit (INDEPENDENT_AMBULATORY_CARE_PROVIDER_SITE_OTHER): Payer: Medicare Other

## 2020-05-01 DIAGNOSIS — S82892A Other fracture of left lower leg, initial encounter for closed fracture: Secondary | ICD-10-CM

## 2020-05-01 DIAGNOSIS — M25572 Pain in left ankle and joints of left foot: Secondary | ICD-10-CM | POA: Diagnosis not present

## 2020-05-01 DIAGNOSIS — S8262XA Displaced fracture of lateral malleolus of left fibula, initial encounter for closed fracture: Secondary | ICD-10-CM | POA: Diagnosis not present

## 2020-05-01 NOTE — ED Triage Notes (Signed)
Pt states she slipped and fell last night and injured her left ankle. Pt denies hitting head. Pt has 2+ swelling of left ankle. Pt limped to exam room. Pt has 1+ left pedal pulse, cap refill less than 3 sec.

## 2020-05-01 NOTE — Discharge Instructions (Signed)
Wear provided boot at all times (may remove briefly to bathe) Don't bear weight on the ankle until seen by orthopedics.  Ice and elevation, ibuprofen as needed for pain.  Please call orthopedics to set up follow up appointment for definitive treatment.

## 2020-05-01 NOTE — ED Provider Notes (Signed)
[facility]    CSN: [id number] Arrival date & time: 05/01/20  1128      History   Chief Complaint Chief Complaint  Patient presents with  . Ankle Injury    HPI Cheryl Leblanc is a 79 y.o. female.   Cheryl Leblanc presents with complaints of left ankle pain. Yesterday she slipped and landed on her foot wrong while in her garage bringing in her garbage can. Her medial ankle did strike the cement with the fall. It is painful, with some pain to lateral ankle. Pain primarily with weight bearing. No numbness or tingling. No previous ankle injury. Took advil last night which did help some with pain.     ROS per HPI, negative if not otherwise mentioned.      Past Medical History:  Diagnosis Date  . Anxiety   . Arthritis   . Depression   . GERD (gastroesophageal reflux disease)   . Headache   . Hypertension     There are no problems to display for this patient.   Past Surgical History:  Procedure Laterality Date  . DILATION AND CURETTAGE OF UTERUS    . ERCP N/A 03/23/2016   Procedure: ENDOSCOPIC RETROGRADE CHOLANGIOPANCREATOGRAPHY (ERCP);  Surgeon: Vida Rigger, MD;  Location: [facility];  Service: Endoscopy;  Laterality: N/A;  . EYE SURGERY     bilateral cataracts with lens implants    OB History   No obstetric history on file.      Home Medications    Prior to Admission medications   Medication Sig Start Date End Date Taking? Authorizing Provider  aspirin 81 MG chewable tablet Chew 81 mg by mouth daily.    [provider]  Calcium Carb-Cholecalciferol (CALCIUM + D3 PO) Take 1 tablet by mouth daily.    [provider]  Cholecalciferol (VITAMIN D-3 PO) Take 1 tablet by mouth daily.    [provider]  CINNAMON PO Take 1 capsule by mouth daily.    [provider]  losartan (COZAAR) 25 MG tablet Take 25 mg by mouth daily. 02/26/20   [provider]  mirtazapine (REMERON) 7.5 MG tablet Take 7.5 mg by mouth at  bedtime. 03/14/16   [provider]  Multiple Vitamins-Minerals (PRESERVISION AREDS PO) Take 1 tablet by mouth daily.    [provider]  Omega-3 Fatty Acids (FISH OIL PO) Take 1 capsule by mouth daily.    [provider]  sertraline (ZOLOFT) 100 MG tablet Take 100 mg by mouth daily after supper.  12/18/15   [provider]  triamcinolone cream (KENALOG) 0.1 % Apply 1 application topically daily as needed (For dermatitis on ears.).    [provider]    Family History No family history on file.  Social History Social History   Tobacco Use  . Smoking status: Current Some Day Smoker    Types: Cigarettes  Substance Use Topics  . Alcohol use: Yes    Comment: occ  . Drug use: No     Allergies   Patient has no known allergies.   Review of Systems Review of Systems   Physical Exam Triage Vital Signs ED Triage Vitals [05/01/20 1251]  Enc Vitals Group     BP (!) 132/92     Pulse Rate 79     Resp 18     Temp 97.8 F (36.6 C)     Temp Source Oral     SpO2 99 %     Weight 126 lb (57.2  kg)     Height 5' 2.5" (1.588 m)     Head Circumference      Peak Flow      Pain Score 4     Pain Loc      Pain Edu?      Excl. in GC?    No data found.  Updated Vital Signs BP (!) 132/92   Pulse 79   Temp 97.8 F (36.6 C) (Oral)   Resp 18   Ht 5' 2.5" (1.588 m)   Wt 126 lb (57.2 kg)   SpO2 99%   BMI 22.68 kg/m   Visual Acuity Right Eye Distance:   Left Eye Distance:   Bilateral Distance:    Right Eye Near:   Left Eye Near:    Bilateral Near:     Physical Exam Constitutional:      General: She is not in acute distress.    Appearance: She is well-developed.  Cardiovascular:     Rate and Rhythm: Normal rate.  Pulmonary:     Effort: Pulmonary effort is normal.  Musculoskeletal:     Left ankle: Swelling present. Tenderness present over the lateral malleolus and medial malleolus. Decreased range of motion.     Left foot:  Normal.     Comments: Bruising noted to medial malleolus with tenderness, tenderness and swelling to lateral malleolus; pain with dorsiflexion; strong pedal pulse   Skin:    General: Skin is warm and dry.  Neurological:     Mental Status: She is alert and oriented to person, place, and time.      UC Treatments / Results  Labs (all labs ordered are listed, but only abnormal results are displayed) Labs Reviewed - No data to display  EKG   Radiology DG Ankle Complete Left  Result Date: 05/01/2020 CLINICAL DATA:  Left ankle pain. EXAM: LEFT ANKLE COMPLETE - 3+ VIEW COMPARISON:  No recent. FINDINGS: Soft tissue swelling noted over the lateral malleolus. Oblique slightly displaced fracture noted of the distal fibula. Nondisplaced fracture of the medial malleolus cannot be excluded. IMPRESSION: 1.  Slight displaced fracture of the distal fibula. 2. Nondisplaced fracture of the medial malleolus cannot be excluded. Electronically Signed   By: Maisie Fus  Register   On: 05/01/2020 13:20    Procedures Procedures (including critical care time)  Medications Ordered in UC Medications - No data to display  Initial Impression / Assessment and Plan / UC Course  I have reviewed the triage vital signs and the nursing notes.  Pertinent labs & imaging results that were available during my care of the patient were reviewed by me and considered in my medical decision making (see chart for details).     Distal fibula fracture, distal tibial fracture cannot be excluded on x-ray per radiology. Cam walker placed, NWB. Patient has crutches already. Pain management discussed, declines any need for medications above ibuprofen. Follow up with orthopedics. Patient verbalized understanding and agreeable to plan.   Final Clinical Impressions(s) / UC Diagnoses   Final diagnoses:  Closed fracture of left ankle, initial encounter     Discharge Instructions     Wear provided boot at all times (may remove  briefly to bathe) Don't bear weight on the ankle until seen by orthopedics.  Ice and elevation, ibuprofen as needed for pain.  Please call orthopedics to set up follow up appointment for definitive treatment.    ED Prescriptions    None     PDMP not reviewed this encounter.  Cheryl Haber, NP 05/01/20 1354

## 2020-05-04 DIAGNOSIS — S8262XA Displaced fracture of lateral malleolus of left fibula, initial encounter for closed fracture: Secondary | ICD-10-CM | POA: Diagnosis not present

## 2020-06-01 DIAGNOSIS — S8262XD Displaced fracture of lateral malleolus of left fibula, subsequent encounter for closed fracture with routine healing: Secondary | ICD-10-CM | POA: Diagnosis not present

## 2020-06-30 DIAGNOSIS — S8262XD Displaced fracture of lateral malleolus of left fibula, subsequent encounter for closed fracture with routine healing: Secondary | ICD-10-CM | POA: Diagnosis not present

## 2020-07-31 DIAGNOSIS — Z961 Presence of intraocular lens: Secondary | ICD-10-CM | POA: Diagnosis not present

## 2020-07-31 DIAGNOSIS — H02839 Dermatochalasis of unspecified eye, unspecified eyelid: Secondary | ICD-10-CM | POA: Diagnosis not present

## 2020-07-31 DIAGNOSIS — H18413 Arcus senilis, bilateral: Secondary | ICD-10-CM | POA: Diagnosis not present

## 2020-07-31 DIAGNOSIS — Z23 Encounter for immunization: Secondary | ICD-10-CM | POA: Diagnosis not present

## 2020-07-31 DIAGNOSIS — H16223 Keratoconjunctivitis sicca, not specified as Sjogren's, bilateral: Secondary | ICD-10-CM | POA: Diagnosis not present

## 2020-08-10 DIAGNOSIS — Z1389 Encounter for screening for other disorder: Secondary | ICD-10-CM | POA: Diagnosis not present

## 2020-08-10 DIAGNOSIS — F17210 Nicotine dependence, cigarettes, uncomplicated: Secondary | ICD-10-CM | POA: Diagnosis not present

## 2020-08-10 DIAGNOSIS — Z79899 Other long term (current) drug therapy: Secondary | ICD-10-CM | POA: Diagnosis not present

## 2020-08-10 DIAGNOSIS — I129 Hypertensive chronic kidney disease with stage 1 through stage 4 chronic kidney disease, or unspecified chronic kidney disease: Secondary | ICD-10-CM | POA: Diagnosis not present

## 2020-08-10 DIAGNOSIS — N1831 Chronic kidney disease, stage 3a: Secondary | ICD-10-CM | POA: Diagnosis not present

## 2020-08-10 DIAGNOSIS — Z0000 Encounter for general adult medical examination without abnormal findings: Secondary | ICD-10-CM | POA: Diagnosis not present

## 2020-08-10 DIAGNOSIS — F3341 Major depressive disorder, recurrent, in partial remission: Secondary | ICD-10-CM | POA: Diagnosis not present

## 2021-02-15 DIAGNOSIS — I129 Hypertensive chronic kidney disease with stage 1 through stage 4 chronic kidney disease, or unspecified chronic kidney disease: Secondary | ICD-10-CM | POA: Diagnosis not present

## 2021-02-15 DIAGNOSIS — N1831 Chronic kidney disease, stage 3a: Secondary | ICD-10-CM | POA: Diagnosis not present

## 2021-08-02 DIAGNOSIS — Z23 Encounter for immunization: Secondary | ICD-10-CM | POA: Diagnosis not present

## 2021-09-02 DIAGNOSIS — D696 Thrombocytopenia, unspecified: Secondary | ICD-10-CM | POA: Diagnosis not present

## 2021-09-02 DIAGNOSIS — F3341 Major depressive disorder, recurrent, in partial remission: Secondary | ICD-10-CM | POA: Diagnosis not present

## 2021-09-02 DIAGNOSIS — I129 Hypertensive chronic kidney disease with stage 1 through stage 4 chronic kidney disease, or unspecified chronic kidney disease: Secondary | ICD-10-CM | POA: Diagnosis not present

## 2021-09-02 DIAGNOSIS — N1831 Chronic kidney disease, stage 3a: Secondary | ICD-10-CM | POA: Diagnosis not present

## 2022-03-29 DIAGNOSIS — D696 Thrombocytopenia, unspecified: Secondary | ICD-10-CM | POA: Diagnosis not present

## 2022-03-29 DIAGNOSIS — N1831 Chronic kidney disease, stage 3a: Secondary | ICD-10-CM | POA: Diagnosis not present

## 2022-03-29 DIAGNOSIS — Z1331 Encounter for screening for depression: Secondary | ICD-10-CM | POA: Diagnosis not present

## 2022-03-29 DIAGNOSIS — I129 Hypertensive chronic kidney disease with stage 1 through stage 4 chronic kidney disease, or unspecified chronic kidney disease: Secondary | ICD-10-CM | POA: Diagnosis not present

## 2022-03-29 DIAGNOSIS — Z0000 Encounter for general adult medical examination without abnormal findings: Secondary | ICD-10-CM | POA: Diagnosis not present

## 2022-03-29 DIAGNOSIS — E7800 Pure hypercholesterolemia, unspecified: Secondary | ICD-10-CM | POA: Diagnosis not present

## 2022-03-29 DIAGNOSIS — H6122 Impacted cerumen, left ear: Secondary | ICD-10-CM | POA: Diagnosis not present

## 2022-03-29 DIAGNOSIS — Z79899 Other long term (current) drug therapy: Secondary | ICD-10-CM | POA: Diagnosis not present

## 2022-03-29 DIAGNOSIS — D72819 Decreased white blood cell count, unspecified: Secondary | ICD-10-CM | POA: Diagnosis not present

## 2022-04-12 DIAGNOSIS — H6123 Impacted cerumen, bilateral: Secondary | ICD-10-CM | POA: Diagnosis not present

## 2022-07-05 DIAGNOSIS — Z9849 Cataract extraction status, unspecified eye: Secondary | ICD-10-CM | POA: Diagnosis not present

## 2022-07-05 DIAGNOSIS — H5202 Hypermetropia, left eye: Secondary | ICD-10-CM | POA: Diagnosis not present

## 2022-07-05 DIAGNOSIS — H5211 Myopia, right eye: Secondary | ICD-10-CM | POA: Diagnosis not present

## 2022-07-05 DIAGNOSIS — Z961 Presence of intraocular lens: Secondary | ICD-10-CM | POA: Diagnosis not present

## 2022-07-05 DIAGNOSIS — H524 Presbyopia: Secondary | ICD-10-CM | POA: Diagnosis not present

## 2022-07-05 DIAGNOSIS — H52223 Regular astigmatism, bilateral: Secondary | ICD-10-CM | POA: Diagnosis not present

## 2022-07-05 DIAGNOSIS — H53143 Visual discomfort, bilateral: Secondary | ICD-10-CM | POA: Diagnosis not present

## 2022-07-05 DIAGNOSIS — H43393 Other vitreous opacities, bilateral: Secondary | ICD-10-CM | POA: Diagnosis not present

## 2022-08-17 DIAGNOSIS — Z23 Encounter for immunization: Secondary | ICD-10-CM | POA: Diagnosis not present

## 2023-05-05 DIAGNOSIS — I129 Hypertensive chronic kidney disease with stage 1 through stage 4 chronic kidney disease, or unspecified chronic kidney disease: Secondary | ICD-10-CM | POA: Diagnosis not present

## 2023-05-05 DIAGNOSIS — D72819 Decreased white blood cell count, unspecified: Secondary | ICD-10-CM | POA: Diagnosis not present

## 2023-05-05 DIAGNOSIS — D696 Thrombocytopenia, unspecified: Secondary | ICD-10-CM | POA: Diagnosis not present

## 2023-05-05 DIAGNOSIS — Z0000 Encounter for general adult medical examination without abnormal findings: Secondary | ICD-10-CM | POA: Diagnosis not present

## 2023-05-05 DIAGNOSIS — E7800 Pure hypercholesterolemia, unspecified: Secondary | ICD-10-CM | POA: Diagnosis not present

## 2023-05-05 DIAGNOSIS — Z79899 Other long term (current) drug therapy: Secondary | ICD-10-CM | POA: Diagnosis not present

## 2023-05-05 DIAGNOSIS — E559 Vitamin D deficiency, unspecified: Secondary | ICD-10-CM | POA: Diagnosis not present

## 2023-05-05 DIAGNOSIS — N1831 Chronic kidney disease, stage 3a: Secondary | ICD-10-CM | POA: Diagnosis not present

## 2023-07-25 DIAGNOSIS — Z961 Presence of intraocular lens: Secondary | ICD-10-CM | POA: Diagnosis not present

## 2023-07-25 DIAGNOSIS — H524 Presbyopia: Secondary | ICD-10-CM | POA: Diagnosis not present

## 2023-07-25 DIAGNOSIS — H5202 Hypermetropia, left eye: Secondary | ICD-10-CM | POA: Diagnosis not present

## 2023-07-25 DIAGNOSIS — Z9849 Cataract extraction status, unspecified eye: Secondary | ICD-10-CM | POA: Diagnosis not present

## 2023-07-25 DIAGNOSIS — H5211 Myopia, right eye: Secondary | ICD-10-CM | POA: Diagnosis not present

## 2023-07-25 DIAGNOSIS — H53143 Visual discomfort, bilateral: Secondary | ICD-10-CM | POA: Diagnosis not present

## 2023-07-25 DIAGNOSIS — H43393 Other vitreous opacities, bilateral: Secondary | ICD-10-CM | POA: Diagnosis not present

## 2023-07-25 DIAGNOSIS — H52223 Regular astigmatism, bilateral: Secondary | ICD-10-CM | POA: Diagnosis not present
